# Patient Record
Sex: Male | Born: 1975 | Race: Black or African American | Hispanic: No | Marital: Married | Smoking: Former smoker
Health system: Southern US, Community
[De-identification: ages and names within clinical notes are randomized; demographics above are authoritative.]

## PROBLEM LIST (undated history)

## (undated) DIAGNOSIS — Z789 Other specified health status: Secondary | ICD-10-CM

## (undated) HISTORY — PX: NO PAST SURGERIES: SHX2092

---

## 2014-04-25 ENCOUNTER — Other Ambulatory Visit (INDEPENDENT_AMBULATORY_CARE_PROVIDER_SITE_OTHER): Payer: Self-pay | Admitting: *Deleted

## 2014-04-25 ENCOUNTER — Other Ambulatory Visit (INDEPENDENT_AMBULATORY_CARE_PROVIDER_SITE_OTHER): Payer: Self-pay

## 2014-04-25 DIAGNOSIS — D171 Benign lipomatous neoplasm of skin and subcutaneous tissue of trunk: Secondary | ICD-10-CM

## 2014-04-30 ENCOUNTER — Ambulatory Visit
Admission: RE | Admit: 2014-04-30 | Discharge: 2014-04-30 | Disposition: A | Discharge status: Home / Self Care | Payer: 59 | Source: Ambulatory Visit | Attending: General Surgery | Admitting: General Surgery

## 2014-05-09 NOTE — Progress Notes (Signed)
Please put orders in Epic surgery 05-14-14 for Same day surgery Thanks

## 2014-05-10 ENCOUNTER — Ambulatory Visit (INDEPENDENT_AMBULATORY_CARE_PROVIDER_SITE_OTHER): Payer: Self-pay | Admitting: General Surgery

## 2014-05-10 ENCOUNTER — Encounter (HOSPITAL_COMMUNITY): Payer: Self-pay | Admitting: *Deleted

## 2014-05-10 NOTE — H&P (Signed)
Calvin Wall 04/25/2014 9:02 AM Location: [facility] Patient #: [id number] DOB: 1976-02-07 Married / Language: English / Race: Black or African American Male  History of Present Illness Calvin Wall. Calvin Rossi MD; 04/25/2014 9:32 AM) Patient words: lipoma.  The patient is a 39 year old male who presents with a soft tissue mass. He is referred by Dr Kristie Cowman for evaluation of right upper back soft tissue mass. The patient states that the area has been present for at least 5 years however over the past several years it has gotten larger with time. It causes no severe pain or discomfort. He denies any numbness or tingling. He mainly notices it when he drives or when he sleeps because it does cause some discomfort when he puts pressure on the area. He denies any fever, chills, night sweats, weight loss, trauma, prior skin infection in that area. He denies any other soft tissue masses. He does smoke 4-5 cigarettes a day. He has delayed evaluation of the lesion because of not having insurance coverage. 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  Diagnostic Studies History (Calvin Eversole, LPN; 09/11/4578 9:98 AM) Colonoscopy never  Allergies (Calvin Eversole, LPN; 3/38/2505 3:97 AM) No Known Drug Allergies02/24/2016  Medication History (Calvin Eversole, LPN; 6/73/4193 7:90 AM) No Current Medications  Review of Systems (Calvin Eversole LPN; 2/40/9735 3:29 AM) General Not Present- Appetite Loss, Chills, Fatigue, Fever, Night Sweats, Weight Gain and Weight Loss. Skin Not Present- Change in Wart/Mole, Dryness, Hives, Jaundice, New Lesions, Non-Healing Wounds, Rash and Ulcer. HEENT Not Present- Earache, Hearing Loss, Hoarseness, Nose Bleed, Oral Ulcers, Ringing in the  Ears, Seasonal Allergies, Sinus Pain, Sore Throat, Visual Disturbances, Wears glasses/contact lenses and Yellow Eyes. Cardiovascular Not Present- Chest Pain, Difficulty Breathing Lying Down, Leg Cramps, Palpitations, Rapid Heart Rate, Shortness of Breath and Swelling of Extremities. Gastrointestinal Not Present- Abdominal Pain, Bloating, Bloody Stool, Change in Bowel Habits, Chronic diarrhea, Constipation, Difficulty Swallowing, Excessive gas, Gets full quickly at meals, Hemorrhoids, Indigestion, Nausea, Rectal Pain and Vomiting. Male Genitourinary Not Present- Blood in Urine, Change in Urinary Stream, Frequency, Impotence, Nocturia, Painful Urination, Urgency and Urine Leakage. Musculoskeletal Not Present- Back Pain, Joint Pain, Joint Stiffness, Muscle Pain, Muscle Weakness and Swelling of Extremities. Neurological Not Present- Decreased Memory, Fainting, Headaches, Numbness, Seizures, Tingling, Tremor, Trouble walking and Weakness. Endocrine Not Present- Cold Intolerance, Excessive Hunger, Hair Changes, Heat Intolerance, Hot flashes and New Diabetes. Hematology Not Present- Easy Bruising, Excessive bleeding, Gland problems, HIV and Persistent Infections.   Vitals (Calvin Eversole LPN; 11/23/2681 4:19 AM) 04/25/2014 9:03 AM Weight: 201.4 lb Height: 71in Body Surface Area: 2.14 m Body Mass Index: 28.09 kg/m Temp.: 97.14F(Oral)  Pulse: 82 (Regular)  BP: 108/72 (Sitting, Left Arm, Standard)    Physical Exam [name] M. [name] MD; 04/25/2014 9:31 AM) General Mental Status-Alert. General Appearance-Consistent with stated age. Hydration-Well hydrated. Voice-Normal.  Integumentary Note: OVERLYING RIGHT SCAPULA IS VERY LARGE SOFT, NONTENDER SUBCU MASS 20 X20 CM. WELL CIRCUMSCRIBED. SOMEWHAT MOBILE. NO OVERLYING SKIN LESION.   Head and Neck Head-normocephalic, atraumatic with no lesions or palpable masses. Trachea-midline. Thyroid Gland Characteristics - normal size and  consistency.  Eye Eyeball - Bilateral-Extraocular movements intact. Sclera/Conjunctiva - Bilateral-No scleral icterus.  Chest and Lung Exam Chest and lung exam reveals -quiet, even and easy respiratory  effort with no use of accessory muscles and on auscultation, normal breath sounds, no adventitious sounds and normal vocal resonance. Inspection Chest Wall - Normal. Back - normal.  Breast - Did not examine.  Cardiovascular Cardiovascular examination reveals -normal heart sounds, regular rate and rhythm with no murmurs and normal pedal pulses bilaterally.  Abdomen Inspection Inspection of the abdomen reveals - No Hernias. Skin - Scar - no surgical scars. Palpation/Percussion Palpation and Percussion of the abdomen reveal - Soft, Non Tender, No Rebound tenderness, No Rigidity (guarding) and No hepatosplenomegaly. Auscultation Auscultation of the abdomen reveals - Bowel sounds normal.  Peripheral Vascular Upper Extremity Palpation - Pulses bilaterally normal.  Neurologic Neurologic evaluation reveals -alert and oriented x 3 with no impairment of recent or remote memory. Mental Status-Normal.  Neuropsychiatric The patient's mood and affect are described as -normal. Judgment and Insight-insight is appropriate concerning matters relevant to self.  Musculoskeletal Normal Exam - Left-Upper Extremity Strength Normal and Lower Extremity Strength Normal. Normal Exam - Right-Upper Extremity Strength Normal and Lower Extremity Strength Normal.  Lymphatic Head & Neck  General Head & Neck Lymphatics: Bilateral - Description - Normal. Axillary -Note: no palpable adenopathy.  Femoral & Inguinal - Did not examine.    Assessment & Plan [name] M. 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 I believe it is less likely to be a sebaceous cyst. Given the chronicity of the mass I believe the chances  of it having any type of malignancy would be quite rare. Nonetheless given the sheer size of it I have recommended an ultrasound just to make sure there are no suspicious features before we get operating room. We discussed the only way to manage this is for surgical excision. See below for discussion regarding risk and benefits of surgery Current Plans  We discussed the etiology and management of lipomas. The patient was given educational material. We discussed that the majority of lipomas are benign although on a rare occasion it can be malignant.  We discussed observation versus surgical excision. We discussed the risks and benefits of surgery including but not limited to bleeding, infection, injury to surrounding structures, scarring, hematoma/seroma, cosmetic concerns, temporary drain placement, blood clot formation, anesthesia issues, possible recurrence, and the typical postoperative course.  The patient has elected to proceed with surgery. But we will get an ultrasound of the area given the size of the mass. Our office will contact you to schedule surgery Schedule for Surgery  Calvin Wall. Redmond Pulling, MD, FACS General, Bariatric, & Minimally Invasive Surgery [facility], [place]

## 2014-05-14 ENCOUNTER — Encounter (HOSPITAL_COMMUNITY): Payer: Self-pay

## 2014-05-14 ENCOUNTER — Ambulatory Visit (HOSPITAL_COMMUNITY)
Admission: RE | Admit: 2014-05-14 | Discharge: 2014-05-14 | Disposition: A | Discharge status: Home / Self Care | Payer: 59 | Source: Ambulatory Visit | Attending: General Surgery | Admitting: General Surgery

## 2014-05-14 ENCOUNTER — Encounter (HOSPITAL_COMMUNITY)
Admission: RE | Disposition: A | Discharge status: Home / Self Care | Payer: Self-pay | Source: Ambulatory Visit | Attending: General Surgery

## 2014-05-14 ENCOUNTER — Ambulatory Visit (HOSPITAL_COMMUNITY): Payer: 59 | Admitting: Anesthesiology

## 2014-05-14 DIAGNOSIS — D171 Benign lipomatous neoplasm of skin and subcutaneous tissue of trunk: Secondary | ICD-10-CM | POA: Diagnosis not present

## 2014-05-14 DIAGNOSIS — F17210 Nicotine dependence, cigarettes, uncomplicated: Secondary | ICD-10-CM | POA: Diagnosis not present

## 2014-05-14 HISTORY — DX: Other specified health status: Z78.9

## 2014-05-14 HISTORY — PX: LIPOMA EXCISION: SHX5283

## 2014-05-14 LAB — CBC
HCT: 40.2 % (ref 39.0–52.0)
Hemoglobin: 13.1 g/dL (ref 13.0–17.0)
MCH: 27.3 pg (ref 26.0–34.0)
MCHC: 32.6 g/dL (ref 30.0–36.0)
MCV: 83.8 fL (ref 78.0–100.0)
PLATELETS: 199 10*3/uL (ref 150–400)
RBC: 4.8 MIL/uL (ref 4.22–5.81)
RDW: 13.5 % (ref 11.5–15.5)
WBC: 3.9 10*3/uL — AB (ref 4.0–10.5)

## 2014-05-14 SURGERY — EXCISION LIPOMA
Anesthesia: General | Site: Back

## 2014-05-14 MED ORDER — FENTANYL CITRATE 0.05 MG/ML IJ SOLN
INTRAMUSCULAR | Status: DC | PRN
  Administered 2014-05-14: 50 ug via INTRAVENOUS
  Administered 2014-05-14: 100 ug via INTRAVENOUS

## 2014-05-14 MED ORDER — SODIUM CHLORIDE 0.9 % IV SOLN: 250.0000 mL | INTRAVENOUS | Status: DC | PRN

## 2014-05-14 MED ORDER — OXYCODONE HCL 5 MG PO TABS: 5.0000 mg | ORAL_TABLET | ORAL | Status: DC | PRN

## 2014-05-14 MED ORDER — CHLORHEXIDINE GLUCONATE 4 % EX LIQD: 1.0000 "application " | Freq: Once | CUTANEOUS | Status: DC

## 2014-05-14 MED ORDER — MIDAZOLAM HCL 5 MG/5ML IJ SOLN
INTRAMUSCULAR | Status: DC | PRN
  Administered 2014-05-14: 2 mg via INTRAVENOUS

## 2014-05-14 MED ORDER — MORPHINE SULFATE 10 MG/ML IJ SOLN: 1.0000 mg | INTRAMUSCULAR | Status: DC | PRN

## 2014-05-14 MED ORDER — LIDOCAINE HCL (CARDIAC) 20 MG/ML IV SOLN
INTRAVENOUS | Status: DC | PRN
  Administered 2014-05-14: 100 mg via INTRAVENOUS

## 2014-05-14 MED ORDER — PROMETHAZINE HCL 25 MG/ML IJ SOLN: 6.2500 mg | INTRAMUSCULAR | Status: DC | PRN

## 2014-05-14 MED ORDER — 0.9 % SODIUM CHLORIDE (POUR BTL) OPTIME
TOPICAL | Status: DC | PRN
  Administered 2014-05-14: 1000 mL

## 2014-05-14 MED ORDER — SODIUM CHLORIDE 0.9 % IJ SOLN: 3.0000 mL | Freq: Two times a day (BID) | INTRAMUSCULAR | Status: DC

## 2014-05-14 MED ORDER — MIDAZOLAM HCL 2 MG/2ML IJ SOLN
INTRAMUSCULAR | Status: AC
  Filled 2014-05-14: qty 2

## 2014-05-14 MED ORDER — SODIUM CHLORIDE 0.9 % IJ SOLN: 3.0000 mL | INTRAMUSCULAR | Status: DC | PRN

## 2014-05-14 MED ORDER — PROPOFOL 10 MG/ML IV BOLUS
INTRAVENOUS | Status: DC | PRN
  Administered 2014-05-14: 200 mg via INTRAVENOUS

## 2014-05-14 MED ORDER — PROPOFOL 10 MG/ML IV BOLUS
INTRAVENOUS | Status: AC
  Filled 2014-05-14: qty 20

## 2014-05-14 MED ORDER — OXYCODONE HCL 5 MG PO TABS: 5.0000 mg | ORAL_TABLET | Freq: Once | ORAL | Status: AC | PRN

## 2014-05-14 MED ORDER — CEFAZOLIN SODIUM-DEXTROSE 2-3 GM-% IV SOLR
INTRAVENOUS | Status: AC
  Filled 2014-05-14: qty 50

## 2014-05-14 MED ORDER — OXYCODONE-ACETAMINOPHEN 5-325 MG PO TABS: 1.0000 | ORAL_TABLET | ORAL | Status: DC | PRN

## 2014-05-14 MED ORDER — OXYCODONE HCL 5 MG/5ML PO SOLN
5.0000 mg | Freq: Once | ORAL | Status: AC | PRN
  Filled 2014-05-14: qty 5

## 2014-05-14 MED ORDER — TISSEEL VH 10 ML EX KIT
PACK | CUTANEOUS | Status: AC
  Filled 2014-05-14: qty 1

## 2014-05-14 MED ORDER — BUPIVACAINE-EPINEPHRINE 0.25% -1:200000 IJ SOLN
INTRAMUSCULAR | Status: DC | PRN
  Administered 2014-05-14: 28.5 mL

## 2014-05-14 MED ORDER — SUCCINYLCHOLINE CHLORIDE 20 MG/ML IJ SOLN
INTRAMUSCULAR | Status: DC | PRN
  Administered 2014-05-14: 100 mg via INTRAVENOUS

## 2014-05-14 MED ORDER — LACTATED RINGERS IV SOLN
INTRAVENOUS | Status: DC
  Administered 2014-05-14: 1000 mL via INTRAVENOUS

## 2014-05-14 MED ORDER — BACITRACIN ZINC 500 UNIT/GM EX OINT
TOPICAL_OINTMENT | CUTANEOUS | Status: DC | PRN
  Administered 2014-05-14: 1 via TOPICAL

## 2014-05-14 MED ORDER — HYDROMORPHONE HCL 1 MG/ML IJ SOLN: 0.2500 mg | INTRAMUSCULAR | Status: DC | PRN

## 2014-05-14 MED ORDER — DEXAMETHASONE SODIUM PHOSPHATE 10 MG/ML IJ SOLN
INTRAMUSCULAR | Status: DC | PRN
  Administered 2014-05-14: 10 mg via INTRAVENOUS

## 2014-05-14 MED ORDER — FENTANYL CITRATE 0.05 MG/ML IJ SOLN
INTRAMUSCULAR | Status: AC
  Filled 2014-05-14: qty 5

## 2014-05-14 MED ORDER — ACETAMINOPHEN 650 MG RE SUPP
650.0000 mg | RECTAL | Status: DC | PRN
Start: 2014-05-14 — End: 2014-05-15
  Filled 2014-05-14: qty 1

## 2014-05-14 MED ORDER — BUPIVACAINE-EPINEPHRINE (PF) 0.25% -1:200000 IJ SOLN
INTRAMUSCULAR | Status: AC
  Filled 2014-05-14: qty 30

## 2014-05-14 MED ORDER — LIDOCAINE HCL (CARDIAC) 20 MG/ML IV SOLN
INTRAVENOUS | Status: AC
  Filled 2014-05-14: qty 5

## 2014-05-14 MED ORDER — BACITRACIN ZINC 500 UNIT/GM EX OINT
TOPICAL_OINTMENT | CUTANEOUS | Status: AC
  Filled 2014-05-14: qty 28.35

## 2014-05-14 MED ORDER — ROCURONIUM BROMIDE 100 MG/10ML IV SOLN
INTRAVENOUS | Status: AC
  Filled 2014-05-14: qty 1

## 2014-05-14 MED ORDER — CEFAZOLIN SODIUM-DEXTROSE 2-3 GM-% IV SOLR
2.0000 g | INTRAVENOUS | Status: AC
  Administered 2014-05-14: 2 g via INTRAVENOUS

## 2014-05-14 MED ORDER — ACETAMINOPHEN 325 MG PO TABS: 650.0000 mg | ORAL_TABLET | ORAL | Status: DC | PRN

## 2014-05-14 MED ORDER — ONDANSETRON HCL 4 MG/2ML IJ SOLN
INTRAMUSCULAR | Status: DC | PRN
  Administered 2014-05-14: 4 mg via INTRAVENOUS

## 2014-05-14 MED ORDER — KETOROLAC TROMETHAMINE 30 MG/ML IJ SOLN: 30.0000 mg | Freq: Four times a day (QID) | INTRAMUSCULAR | Status: DC

## 2014-05-14 SURGICAL SUPPLY — 39 items
BENZOIN TINCTURE PRP APPL 2/3 (GAUZE/BANDAGES/DRESSINGS) IMPLANT
BLADE HEX COATED 2.75 (ELECTRODE) ×3 IMPLANT
BLADE SURG 15 STRL LF DISP TIS (BLADE) ×1 IMPLANT
BLADE SURG 15 STRL SS (BLADE) ×2
BLADE SURG SZ10 CARB STEEL (BLADE) IMPLANT
COVER SURGICAL LIGHT HANDLE (MISCELLANEOUS) IMPLANT
DECANTER SPIKE VIAL GLASS SM (MISCELLANEOUS) IMPLANT
DRAIN CHANNEL 15F RND FF 3/16 (WOUND CARE) ×3 IMPLANT
DRAPE LAPAROTOMY T 102X78X121 (DRAPES) ×3 IMPLANT
DRAPE LAPAROTOMY T 98X78 PEDS (DRAPES) IMPLANT
DRAPE LAPAROTOMY TRNSV 102X78 (DRAPE) IMPLANT
DRAPE SHEET LG 3/4 BI-LAMINATE (DRAPES) ×3 IMPLANT
DRAPE UTILITY XL STRL (DRAPES) ×3 IMPLANT
ELECT REM PT RETURN 9FT ADLT (ELECTROSURGICAL) ×3
ELECTRODE REM PT RTRN 9FT ADLT (ELECTROSURGICAL) ×1 IMPLANT
EVACUATOR DRAINAGE 7X20 100CC (MISCELLANEOUS) ×1 IMPLANT
EVACUATOR SILICONE 100CC (MISCELLANEOUS) ×2
GAUZE PACKING IODOFORM 1/4X15 (GAUZE/BANDAGES/DRESSINGS) IMPLANT
GAUZE SPONGE 4X4 12PLY STRL (GAUZE/BANDAGES/DRESSINGS) ×3 IMPLANT
GLOVE BIO SURGEON STRL SZ7.5 (GLOVE) ×3 IMPLANT
GLOVE BIOGEL M STRL SZ7.5 (GLOVE) IMPLANT
GLOVE INDICATOR 8.0 STRL GRN (GLOVE) ×3 IMPLANT
GOWN STRL REUS W/TWL LRG LVL3 (GOWN DISPOSABLE) ×3 IMPLANT
GOWN STRL REUS W/TWL XL LVL3 (GOWN DISPOSABLE) ×6 IMPLANT
KIT BASIN OR (CUSTOM PROCEDURE TRAY) ×3 IMPLANT
MARKER SKIN DUAL TIP RULER LAB (MISCELLANEOUS) IMPLANT
NEEDLE HYPO 25X1 1.5 SAFETY (NEEDLE) ×3 IMPLANT
PACK BASIC VI WITH GOWN DISP (CUSTOM PROCEDURE TRAY) ×3 IMPLANT
PENCIL BUTTON HOLSTER BLD 10FT (ELECTRODE) ×3 IMPLANT
SPONGE LAP 18X18 X RAY DECT (DISPOSABLE) IMPLANT
SPONGE LAP 4X18 X RAY DECT (DISPOSABLE) ×3 IMPLANT
STAPLER VISISTAT 35W (STAPLE) ×3 IMPLANT
SUT ETHILON 2 0 PS N (SUTURE) ×12 IMPLANT
SUT MNCRL AB 4-0 PS2 18 (SUTURE) ×3 IMPLANT
SUT VIC AB 3-0 SH 18 (SUTURE) ×6 IMPLANT
SYR CONTROL 10ML LL (SYRINGE) ×3 IMPLANT
TAPE CLOTH SURG 4X10 WHT LF (GAUZE/BANDAGES/DRESSINGS) ×3 IMPLANT
TOWEL OR 17X26 10 PK STRL BLUE (TOWEL DISPOSABLE) ×3 IMPLANT
YANKAUER SUCT BULB TIP 10FT TU (MISCELLANEOUS) IMPLANT

## 2014-05-14 NOTE — Anesthesia Preprocedure Evaluation (Signed)
Anesthesia Evaluation  Patient identified by MRN, date of birth, ID band Patient awake    Reviewed: Allergy & Precautions, NPO status , Patient's Chart, lab work & pertinent test results  Airway Mallampati: II  TM Distance: >3 FB Neck ROM: Full    Dental no notable dental hx.    Pulmonary neg pulmonary ROS, Current Smoker,  breath sounds clear to auscultation  Pulmonary exam normal       Cardiovascular negative cardio ROS  Rhythm:Regular Rate:Normal     Neuro/Psych negative neurological ROS  negative psych ROS   GI/Hepatic negative GI ROS, Neg liver ROS,   Endo/Other  negative endocrine ROS  Renal/GU negative Renal ROS  negative genitourinary   Musculoskeletal negative musculoskeletal ROS (+)   Abdominal   Peds negative pediatric ROS (+)  Hematology negative hematology ROS (+)   Anesthesia Other Findings   Reproductive/Obstetrics negative OB ROS                             Anesthesia Physical Anesthesia Plan  ASA: II  Anesthesia Plan: General   Post-op Pain Management:    Induction: Intravenous  Airway Management Planned: LMA and Oral ETT  Additional Equipment:   Intra-op Plan:   Post-operative Plan: Extubation in OR  Informed Consent: I have reviewed the patients History and Physical, chart, labs and discussed the procedure including the risks, benefits and alternatives for the proposed anesthesia with the patient or authorized representative who has indicated his/her understanding and acceptance.   Dental advisory given  Plan Discussed with: CRNA  Anesthesia Plan Comments: (ETT if prone)        Anesthesia Quick Evaluation

## 2014-05-14 NOTE — H&P (View-Only) (Signed)
Calvin Wall 04/25/2014 9:02 AM Location: [facility] Patient #: [id number] DOB: 1976/02/05 Married / Language: English / Race: Black or African American Male  History of Present Illness Calvin Wall. Lyda Colcord MD; 04/25/2014 9:32 AM) Patient words: lipoma.  The patient is a 39 year old male who presents with a soft tissue mass. He is referred by Dr Kristie Cowman for evaluation of right upper back soft tissue mass. The patient states that the area has been present for at least 5 years however over the past several years it has gotten larger with time. It causes no severe pain or discomfort. He denies any numbness or tingling. He mainly notices it when he drives or when he sleeps because it does cause some discomfort when he puts pressure on the area. He denies any fever, chills, night sweats, weight loss, trauma, prior skin infection in that area. He denies any other soft tissue masses. He does smoke 4-5 cigarettes a day. He has delayed evaluation of the lesion because of not having insurance coverage. 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  Diagnostic Studies History ([name], LPN; 5/88/5027 7:41 AM) Colonoscopy never  Allergies ([name], LPN; 2/87/8676 7:20 AM) No Known Drug Allergies02/24/2016  Medication History ([name], LPN; 9/47/0962 8:36 AM) No Current Medications  Review of Systems ([name] LPN; 08/29/4763 4:65 AM) General Not Present- Appetite Loss, Chills, Fatigue, Fever, Night Sweats, Weight Gain and Weight Loss. Skin Not Present- Change in Wart/Mole, Dryness, Hives, Jaundice, New Lesions, Non-Healing Wounds, Rash and Ulcer. HEENT Not Present- Earache, Hearing Loss, Hoarseness, Nose Bleed, Oral Ulcers, Ringing in the  Ears, Seasonal Allergies, Sinus Pain, Sore Throat, Visual Disturbances, Wears glasses/contact lenses and Yellow Eyes. Cardiovascular Not Present- Chest Pain, Difficulty Breathing Lying Down, Leg Cramps, Palpitations, Rapid Heart Rate, Shortness of Breath and Swelling of Extremities. Gastrointestinal Not Present- Abdominal Pain, Bloating, Bloody Stool, Change in Bowel Habits, Chronic diarrhea, Constipation, Difficulty Swallowing, Excessive gas, Gets full quickly at meals, Hemorrhoids, Indigestion, Nausea, Rectal Pain and Vomiting. Male Genitourinary Not Present- Blood in Urine, Change in Urinary Stream, Frequency, Impotence, Nocturia, Painful Urination, Urgency and Urine Leakage. Musculoskeletal Not Present- Back Pain, Joint Pain, Joint Stiffness, Muscle Pain, Muscle Weakness and Swelling of Extremities. Neurological Not Present- Decreased Memory, Fainting, Headaches, Numbness, Seizures, Tingling, Tremor, Trouble walking and Weakness. Endocrine Not Present- Cold Intolerance, Excessive Hunger, Hair Changes, Heat Intolerance, Hot flashes and New Diabetes. Hematology Not Present- Easy Bruising, Excessive bleeding, Gland problems, HIV and Persistent Infections.   Vitals ([name] LPN; 0/35/4656 8:12 AM) 04/25/2014 9:03 AM Weight: 201.4 lb Height: 71in Body Surface Area: 2.14 m Body Mass Index: 28.09 kg/m Temp.: 97.69F(Oral)  Pulse: 82 (Regular)  BP: 108/72 (Sitting, Left Arm, Standard)    Physical Exam [name] M. Mitul Hallowell MD; 04/25/2014 9:31 AM) General Mental Status-Alert. General Appearance-Consistent with stated age. Hydration-Well hydrated. Voice-Normal.  Integumentary Note: OVERLYING RIGHT SCAPULA IS VERY LARGE SOFT, NONTENDER SUBCU MASS 20 X20 CM. WELL CIRCUMSCRIBED. SOMEWHAT MOBILE. NO OVERLYING SKIN LESION.   Head and Neck Head-normocephalic, atraumatic with no lesions or palpable masses. Trachea-midline. Thyroid Gland Characteristics - normal size and  consistency.  Eye Eyeball - Bilateral-Extraocular movements intact. Sclera/Conjunctiva - Bilateral-No scleral icterus.  Chest and Lung Exam Chest and lung exam reveals -quiet, even and easy respiratory  effort with no use of accessory muscles and on auscultation, normal breath sounds, no adventitious sounds and normal vocal resonance. Inspection Chest Wall - Normal. Back - normal.  Breast - Did not examine.  Cardiovascular Cardiovascular examination reveals -normal heart sounds, regular rate and rhythm with no murmurs and normal pedal pulses bilaterally.  Abdomen Inspection Inspection of the abdomen reveals - No Hernias. Skin - Scar - no surgical scars. Palpation/Percussion Palpation and Percussion of the abdomen reveal - Soft, Non Tender, No Rebound tenderness, No Rigidity (guarding) and No hepatosplenomegaly. Auscultation Auscultation of the abdomen reveals - Bowel sounds normal.  Peripheral Vascular Upper Extremity Palpation - Pulses bilaterally normal.  Neurologic Neurologic evaluation reveals -alert and oriented x 3 with no impairment of recent or remote memory. Mental Status-Normal.  Neuropsychiatric The patient's mood and affect are described as -normal. Judgment and Insight-insight is appropriate concerning matters relevant to self.  Musculoskeletal Normal Exam - Left-Upper Extremity Strength Normal and Lower Extremity Strength Normal. Normal Exam - Right-Upper Extremity Strength Normal and Lower Extremity Strength Normal.  Lymphatic Head & Neck  General Head & Neck Lymphatics: Bilateral - Description - Normal. Axillary -Note: no palpable adenopathy.  Femoral & Inguinal - Did not examine.    Assessment & Plan [name] M. 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 I believe it is less likely to be a sebaceous cyst. Given the chronicity of the mass I believe the chances  of it having any type of malignancy would be quite rare. Nonetheless given the sheer size of it I have recommended an ultrasound just to make sure there are no suspicious features before we get operating room. We discussed the only way to manage this is for surgical excision. See below for discussion regarding risk and benefits of surgery Current Plans  We discussed the etiology and management of lipomas. The patient was given educational material. We discussed that the majority of lipomas are benign although on a rare occasion it can be malignant.  We discussed observation versus surgical excision. We discussed the risks and benefits of surgery including but not limited to bleeding, infection, injury to surrounding structures, scarring, hematoma/seroma, cosmetic concerns, temporary drain placement, blood clot formation, anesthesia issues, possible recurrence, and the typical postoperative course.  The patient has elected to proceed with surgery. But we will get an ultrasound of the area given the size of the mass. Our office will contact you to schedule surgery Schedule for Surgery  Calvin Wall. Redmond Pulling, MD, FACS General, Bariatric, & Minimally Invasive Surgery [facility], [place]

## 2014-05-14 NOTE — Interval H&P Note (Signed)
History and Physical Interval Note:  05/14/2014 11:42 AM  Calvin Wall  has presented today for surgery, with the diagnosis of Upper Back Lipoma  The various methods of treatment have been discussed with the patient and family. After consideration of risks, benefits and other options for treatment, the patient has consented to  Procedure(s): EXCISION UPPER BACK LIPOMA (N/A) as a surgical intervention .  The patient's history has been reviewed, patient examined, no change in status, stable for surgery.  I have reviewed the patient's chart and labs.  Questions were answered to the patient's satisfaction.    Leighton Ruff. Redmond Pulling, MD, [place], Bariatric, & Minimally Invasive Surgery [facility], [place]   [facility]

## 2014-05-14 NOTE — Discharge Instructions (Signed)
[place] Office Phone Number [phone]   POST OP INSTRUCTIONS  Always review your discharge instruction sheet given to you by the facility where your surgery was performed.  IF YOU HAVE DISABILITY OR FAMILY LEAVE FORMS, YOU MUST BRING THEM TO THE OFFICE FOR PROCESSING.  DO NOT GIVE THEM TO YOUR DOCTOR.  1. A prescription for pain medication may be given to you upon discharge.  Take your pain medication as prescribed, if needed.  If narcotic pain medicine is not needed, then you may take acetaminophen (Tylenol) or ibuprofen (Advil) as needed. 2. Take your usually prescribed medications unless otherwise directed 3. If you need a refill on your pain medication, please contact your pharmacy.  They will contact our office to request authorization.  Prescriptions will not be filled after 5pm or on week-ends. 4. You should eat very light the first 24 hours after surgery, such as soup, crackers, pudding, etc.  Resume your normal diet the day after surgery. 5. Most patients will experience some swelling and bruising in the incision.  Ice packs will help.  Swelling and bruising can take several days to resolve.  6. It is common to experience some constipation if taking pain medication after surgery.  Increasing fluid intake and taking a stool softener will usually help or prevent this problem from occurring.  A mild laxative (Milk of Magnesia or Miralax) should be taken according to package directions if there are no bowel movements after 48 hours. 7. Unless discharge instructions indicate otherwise, you may remove your bandages 48 hours after surgery, and you may shower at that time.  Cover incision with dry gauze and then change daily.  Any sutures or staples will be removed at the office during your follow-up visit. 8. ACTIVITIES:  You may resume regular daily activities (gradually increasing) beginning the next day.  .  You may have sexual intercourse when it is comfortable. a. You  may drive when you no longer are taking prescription pain medication, you can comfortably wear a seatbelt, and you can safely maneuver your car and apply brakes. b. RETURN TO WORK:  1-2 weeks WITH RESTRICTIONS 9. You should see your doctor in the office for a follow-up appointment approximately two weeks after your surgery.   OTHER INSTRUCTIONS: DO NOT LIFT, PUSH, OR PULL ANYTHING GREATER THAN 10 POUNDS WITH RIGHT UPPER ARM/SHOULDER FOR 2 WEEKS WHEN TO CALL YOUR DOCTOR: 1. Fever over 101.0 2. Nausea and/or vomiting. 3. Extreme swelling or bruising. 4. Continued bleeding from incision. 5. Increased pain, redness, or drainage from the incision.  The clinic staff is available to answer your questions during regular business hours.  Please dont hesitate to call and ask to speak to one of the nurses for clinical concerns.  If you have a medical emergency, go to the nearest emergency room or call 911.  A surgeon from [facility] is always on call at the hospital.  For further questions, please visit [email or web address]    Bulb Drain Home Care A bulb drain consists of a thin rubber tube and a soft, round bulb that creates a gentle suction. The rubber tube is placed in the area where you had surgery. A bulb is attached to the end of the tube that is outside the body. The bulb drain removes excess fluid that normally builds up in a surgical wound after surgery. The color and amount of fluid will vary. Immediately after surgery, the fluid is bright red and is a little thicker than water.  It may gradually change to a yellow or pink color and become more thin and water-like. When the amount decreases to about 1 or 2 tbsp in 24 hours, your health care provider will usually remove it. DAILY CARE  Keep the bulb flat (compressed) at all times, except while emptying it. The flatness creates suction. You can flatten the bulb by squeezing it firmly in the middle and then closing the  cap.  Keep sites where the tube enters the skin dry and covered with a bandage (dressing).  Secure the tube 1-2 in (2.5-5.1 cm) below the insertion sites to keep it from pulling on your stitches. The tube is stitched in place and will not slip out.  Secure the bulb as directed by your health care provider.  For the first 3 days after surgery, there usually is more fluid in the bulb. Empty the bulb whenever it becomes half full because the bulb does not create enough suction if it is too full. The bulb could also overflow. Write down how much fluid you remove each time you empty your drain. Add up the amount removed in 24 hours.  Empty the bulb at the same time every day once the amount of fluid decreases and you only need to empty it once a day. Write down the amounts and the 24-hour totals to give to your health care provider. This helps your health care provider know when the tubes can be removed. EMPTYING THE BULB DRAIN Before emptying the bulb, get a measuring cup, a piece of paper and a pen, and wash your hands.  Gently run your fingers down the tube (stripping) to empty any drainage from the tubing into the bulb. This may need to be done several times a day to clear the tubing of clots and tissue.  Open the bulb cap to release suction, which causes it to inflate. Do not touch the inside of the cap.  Gently run your fingers down the tube (stripping) to empty any drainage from the tubing into the bulb.  Hold the cap out of the way, and pour fluid into the measuring cup.   Squeeze the bulb to provide suction.  Replace the cap.   Check the tape that holds the tube to your skin. If it is becoming loose, you can remove the loose piece of tape and apply a new one. Then, pin the bulb to your shirt.   Write down the amount of fluid you emptied out. Write down the date and each time you emptied your bulb drain. (If there are 2 bulbs, note the amount of drainage from each bulb and keep the  totals separate. Your health care provider will want to know the total amounts for each drain and which tube is draining more.)   Flush the fluid down the toilet and wash your hands.   Call your health care provider once you have less than 2 tbsp of fluid collecting in the bulb drain every 24 hours. If there is drainage around the tube site, change dressings and keep the area dry. Cleanse around tube with sterile saline and place dry gauze around site. This gauze should be changed when it is soiled. If it stays clean and unsoiled, it should still be changed daily.  SEEK MEDICAL CARE IF:  Your drainage has a bad smell or is cloudy.   You have a fever.   Your drainage is increasing instead of decreasing.   Your tube fell out.   You have redness  or swelling around the tube site.   You have drainage from a surgical wound.   Your bulb drain will not stay flat after you empty it.  MAKE SURE YOU:   Understand these instructions.  Will watch your condition.  Will get help right away if you are not doing well or get worse. Document Released: 02/14/2000 Document Revised: 07/03/2013 Document Reviewed: 07/22/2011 [facility] Patient Information 2015 [place], [place]. This information is not intended to replace advice given to you by your health care provider. Make sure you discuss any questions you have with your health care provider.

## 2014-05-14 NOTE — Transfer of Care (Signed)
Immediate Anesthesia Transfer of Care Note  Patient: Calvin Wall  Procedure(s) Performed: Procedure(s): EXCISION UPPER BACK LIPOMA (N/A)  Patient Location: PACU  Anesthesia Type:General  Level of Consciousness: sedated  Airway & Oxygen Therapy: Patient Spontanous Breathing and Patient connected to face mask oxygen  Post-op Assessment: Report given to RN and Post -op Vital signs reviewed and stable  Post vital signs: Reviewed and stable  Last Vitals:  Filed Vitals:   05/14/14 0906  BP: 134/74  Pulse: 51  Temp: 36.7 C  Resp: 18    Complications: No apparent anesthesia complications

## 2014-05-14 NOTE — Op Note (Signed)
05/14/2014  4:46 PM  PATIENT:  Calvin Wall  39 y.o. male  PRE-OPERATIVE DIAGNOSIS:  Upper Back Lipoma  POST-OPERATIVE DIAGNOSIS:  upper back lipoma  PROCEDURE:  Procedure(s): EXCISION UPPER BACK LIPOMA (20 x 18cm)  SURGEON:  Surgeon(s): Greer Pickerel, MD  ASSISTANTS: none   ANESTHESIA:   general  DRAINS: (15) Blake drain(s) in the subu space   LOCAL MEDICATIONS USED:  MARCAINE     SPECIMEN:  Source of Specimen:  upper back lipoma  DISPOSITION OF SPECIMEN:  PATHOLOGY  COUNTS:  YES  INDICATION FOR PROCEDURE: 39 year old African-American presented to the office complaining of an enlarging mass on his upper back. He states that it been there for many years.  He had delayed intervention because he had no insurance.  It is now interfering With his daily living.  He desired surgical excision.  We did get an ultrasound preoperatively which confirmed a lipoma.  We discussed the risk and benefits of surgery including but not limited to bleeding, infection, seroma formation and hematoma formation, scarring, recurrence, blood clot formation, need for additional procedures, perioperative cardiac and pulmonary issues.  He elected to proceed to surgery  PROCEDURE: After obtaining informed consent he was taken to operating room 11 at [facility] endotracheal anesthesia was established.  He was then placed in the prone position on the operating room table with appropriate padding.  Sequential compression devices had been placed.  His upper back was prepped and draped in the usual standard surgical fashion with ChloraPrep.  He received IV antibiotics prior to skin incision.  A surgical timeout was performed.  The subcutaneous mass was over the medial aspect of his right scapula.  It measured 20 cm vertically by 18 cm wide.  I drew an incision with a marking pen in the midportion of the mass.  Then using a 10 blade I incised the scan for 11 cm.  The deep dermis was divided with  electrocautery.  I then lifted the deep dermis off the underlying lipoma.  I was able to mobilize it first off of the lateral side.  It went all the way down to muscular fascia.  I then circumferentially lifted and mobilized it using electrocautery.  I was eventually able to shell it out in its entirety.  There is no lipoma left behind.  This left a large cavity.  It was irrigated.  Hemostasis is achieved.  The specimen was passed off the field.  Because of the large cavity elected to leave a 15 Blake drain.  A small incision was made inferior lateral to the incision and a drain was placed in the cavity.  I also injected local deep in the fascia as well as in the subcutaneous tissue.  I also applied Tisseel to the cavity to try to get it to scar down to prevent a seroma.  The drain was secured to the skin with a 2-0 nylon.  The deep dermis was reapproximated with inverted 3-0 Vicryl sutures.  The skin was closed with multiple interrupted 2-0 nylon sutures.  Triple antibiotic ointment was placed along the incision followed by dry gauze and a drain sponge.  The patient was then placed in the supine position and extubated.  All needle, instrument and sponge counts were correct 2.  There were no immediate complications.  The patient tolerated the procedure well  PLAN OF CARE: Discharge to home after PACU  PATIENT DISPOSITION:  PACU - hemodynamically stable.   Delay start of Pharmacological VTE agent (>  24hrs) due to surgical blood loss or risk of bleeding:  not applicable  Leighton Ruff. Redmond Pulling, MD, FACS General, Bariatric, & Minimally Invasive Surgery [facility], [place]

## 2014-05-14 NOTE — Anesthesia Postprocedure Evaluation (Signed)
Anesthesia Post Note  Patient: Calvin Wall  Procedure(s) Performed: Procedure(s) (LRB): EXCISION UPPER BACK LIPOMA (N/A)  Anesthesia type: general  Patient location: PACU  Post pain: Pain level controlled  Post assessment: Patient's Cardiovascular Status Stable  Last Vitals:  Filed Vitals:   05/14/14 1400  BP: 149/110  Pulse: 51  Temp:   Resp: 18    Post vital signs: Reviewed and stable  Level of consciousness: sedated  Complications: No apparent anesthesia complications

## 2014-05-15 ENCOUNTER — Encounter (HOSPITAL_COMMUNITY): Payer: Self-pay | Admitting: General Surgery

## 2014-05-31 NOTE — Progress Notes (Signed)
Please put orders in Epic surgery 06-08-14 pre op 06-04-14 Thanks

## 2014-06-01 NOTE — Patient Instructions (Addendum)
Calvin Wall  06/01/2014   Your procedure is scheduled on:  06/08/2014    Report to [facility] Main  Entrance and follow signs to               [place] at      130pm  Call this number if you have problems the morning of surgery [phone]   Remember:  Do not eat food after midnite  May have clear liquids until 0930am then nothing by mouth am of surgery.       Take these medicines the morning of surgery with A SIP OF WATER: NONE                               You may not have any metal on your body including hair pins and              piercings  Do not wear jewelry, , lotions, powders or perfumes., deodorant.                           Men may shave face and neck.   Do not bring valuables to the hospital. [place].  Contacts, dentures or bridgework may not be worn into surgery.      Patients discharged the day of surgery will not be allowed to drive home.  Name and phone number of your driver:  Special Instructions:coughing and deep breathing exercises, leg exercises    CLEAR LIQUID DIET   Foods Allowed                                                                     Foods Excluded  Coffee and tea, regular and decaf                             liquids that you cannot  Plain Jell-O in any flavor                                             see through such as: Fruit ices (not with fruit pulp)                                     milk, soups, orange juice  Iced Popsicles                                    All solid food Carbonated beverages, regular and diet                                    Cranberry, grape and apple  juices Sports drinks like Gatorade Lightly seasoned clear broth or consume(fat free) Sugar, honey syrup  Sample Menu Breakfast                                Lunch                                     Supper Cranberry juice                    Beef broth                             Chicken broth Jell-O                                     Grape juice                           Apple juice Coffee or tea                        Jell-O                                      Popsicle                                                Coffee or tea                        Coffee or tea  _____________________________________________________________________                Please read over the following fact sheets you were given: _____________________________________________________________________             [facility] - Preparing for Surgery Before surgery, you can play an important role.  Because skin is not sterile, your skin needs to be as free of germs as possible.  You can reduce the number of germs on your skin by washing with CHG (chlorahexidine gluconate) soap before surgery.  CHG is an antiseptic cleaner which kills germs and bonds with the skin to continue killing germs even after washing. Please DO NOT use if you have an allergy to CHG or antibacterial soaps.  If your skin becomes reddened/irritated stop using the CHG and inform your nurse when you arrive at Short Stay. Do not shave (including legs and underarms) for at least 48 hours prior to the first CHG shower.  You may shave your face/neck. Please follow these instructions carefully:  1.  Shower with CHG Soap the night before surgery and the  morning of Surgery.  2.  If you choose to wash your hair, wash your hair first as usual with your  normal  shampoo.  3.  After you shampoo, rinse your hair and body thoroughly to remove the  shampoo.  4.  Use CHG as you would any other liquid soap.  You can apply chg directly  to the skin and wash                       Gently with a scrungie or clean washcloth.  5.  Apply the CHG Soap to your body ONLY FROM THE NECK DOWN.   Do not use on face/ open                           Wound or open sores. Avoid contact with eyes, ears mouth and genitals (private  parts).                       Wash face,  Genitals (private parts) with your normal soap.             6.  Wash thoroughly, paying special attention to the area where your surgery  will be performed.  7.  Thoroughly rinse your body with warm water from the neck down.  8.  DO NOT shower/wash with your normal soap after using and rinsing off  the CHG Soap.                9.  Pat yourself dry with a clean towel.            10.  Wear clean pajamas.            11.  Place clean sheets on your bed the night of your first shower and do not  sleep with pets. Day of Surgery : Do not apply any lotions/deodorants the morning of surgery.  Please wear clean clothes to the hospital/surgery center.  FAILURE TO FOLLOW THESE INSTRUCTIONS MAY RESULT IN THE CANCELLATION OF YOUR SURGERY PATIENT SIGNATURE_________________________________  NURSE SIGNATURE__________________________________  ________________________________________________________________________

## 2014-06-01 NOTE — Progress Notes (Signed)
Please put orders in [facility] for surgery on 06/08/2014.  Preop on 06/04/2014 at 1030am.  Thank You.

## 2014-06-02 ENCOUNTER — Ambulatory Visit: Payer: Self-pay | Admitting: General Surgery

## 2014-06-04 ENCOUNTER — Encounter (HOSPITAL_COMMUNITY): Payer: Self-pay

## 2014-06-04 ENCOUNTER — Encounter (HOSPITAL_COMMUNITY)
Admission: RE | Admit: 2014-06-04 | Discharge: 2014-06-04 | Disposition: A | Discharge status: Home / Self Care | Payer: 59 | Source: Ambulatory Visit | Attending: General Surgery | Admitting: General Surgery

## 2014-06-04 DIAGNOSIS — Z01812 Encounter for preprocedural laboratory examination: Secondary | ICD-10-CM | POA: Diagnosis present

## 2014-06-04 LAB — CBC
HCT: 41.3 % (ref 39.0–52.0)
Hemoglobin: 13.5 g/dL (ref 13.0–17.0)
MCH: 27.7 pg (ref 26.0–34.0)
MCHC: 32.7 g/dL (ref 30.0–36.0)
MCV: 84.6 fL (ref 78.0–100.0)
PLATELETS: 226 10*3/uL (ref 150–400)
RBC: 4.88 MIL/uL (ref 4.22–5.81)
RDW: 13.6 % (ref 11.5–15.5)
WBC: 3.9 10*3/uL — ABNORMAL LOW (ref 4.0–10.5)

## 2014-06-04 NOTE — Progress Notes (Signed)
EKG 05-14-14 EPIC

## 2014-06-07 NOTE — H&P (Signed)
  Calvin Wall 05/21/2014 4:08 PM Location: [facility] Patient #: [id number] DOB: 1975-11-13 Married / Language: English / Race: Black or African American Male  History of Present Illness Calvin Ruff. Creed Kail MD; 05/21/2014 4:25 PM) Patient words: lipoma to back.  The patient is a 39 year old male who presents with a soft tissue mass. Patient is status post excision of back mass presumed to be a lipoma on Monday 3/14 by Dr. Redmond Wall. He has a drain. After some of the initial swelling went down the first day, he does feel like there is still a portion of the lipoma present over his spine. It does not hurt, but it is right where he lays down in the center and is not comfortable. saw Dr Barry Dienes 3/17. drainage from drain about 10cc/day   Medication History Multimedia programmer, Therapist, sports, BSN; 05/21/2014 4:11 PM) No Current Medications Medications Reconciled  Vitals Occupational hygienist, BSN; 05/21/2014 4:10 PM) 05/21/2014 4:09 PM Weight: 200.2 lb Height: 71in Body Surface Area: 2.13 m Body Mass Index: 27.92 kg/m Temp.: 98.31F(Oral)  Pulse: 68 (Regular)  Resp.: 20 (Unlabored)  BP: 130/78 (Sitting, Left Arm, Standard)    Physical Exam Calvin Hiss M. Tylin Stradley MD; 05/21/2014 4:24 PM) General Mental Status-Alert. General Appearance-Consistent with stated age. Hydration-Well hydrated. Voice-Normal.  Integumentary Note: 2x3 cm mobile area over spine adjacent to divot from mass. incision c/d/i. no cellulitis, seroma, induration, fluctuance. palpable midline mass, soft well circumscribed. tried to aspirate after some local - no results     Assessment & Plan Calvin Hiss M. Dijon Cosens MD; 05/21/2014 4:27 PM) POSTOP CHECK (V67.00  Z09) Impression: no sign of infection. will remove drain today. too soon for suture removal. will bring back later this week for suture removal. unfortunately he has a persistent midline lipoma. it is not a seroma or hematoma. will plan excision of mid back lipoma  under MAC. explained would not use same incision. same risk/benefits as discussed before. pt would likie proceed. Current Plans  Schedule for Surgery Instructed to keep follow-up appointment as scheduled Instructions: continue current wound care may have a small amount of drainage from old drain site. cover with bandage and change daily. small drain site will seal up/close up over next few days  Calvin Ruff. Redmond Pulling, MD, FACS General, Bariatric, & Minimally Invasive Surgery [facility], [place]

## 2014-06-08 ENCOUNTER — Ambulatory Visit (HOSPITAL_COMMUNITY)
Admission: RE | Admit: 2014-06-08 | Discharge: 2014-06-08 | Disposition: A | Discharge status: Home / Self Care | Payer: 59 | Source: Ambulatory Visit | Attending: General Surgery | Admitting: General Surgery

## 2014-06-08 ENCOUNTER — Ambulatory Visit (HOSPITAL_COMMUNITY): Payer: 59 | Admitting: Anesthesiology

## 2014-06-08 ENCOUNTER — Encounter (HOSPITAL_COMMUNITY): Payer: Self-pay | Admitting: *Deleted

## 2014-06-08 ENCOUNTER — Encounter (HOSPITAL_COMMUNITY)
Admission: RE | Disposition: A | Discharge status: Home / Self Care | Payer: Self-pay | Source: Ambulatory Visit | Attending: General Surgery

## 2014-06-08 DIAGNOSIS — L7621 Postprocedural hemorrhage and hematoma of skin and subcutaneous tissue following a dermatologic procedure: Secondary | ICD-10-CM | POA: Diagnosis not present

## 2014-06-08 DIAGNOSIS — Y838 Other surgical procedures as the cause of abnormal reaction of the patient, or of later complication, without mention of misadventure at the time of the procedure: Secondary | ICD-10-CM | POA: Insufficient documentation

## 2014-06-08 DIAGNOSIS — D171 Benign lipomatous neoplasm of skin and subcutaneous tissue of trunk: Secondary | ICD-10-CM | POA: Insufficient documentation

## 2014-06-08 DIAGNOSIS — Z87891 Personal history of nicotine dependence: Secondary | ICD-10-CM | POA: Diagnosis not present

## 2014-06-08 HISTORY — PX: LIPOMA EXCISION: SHX5283

## 2014-06-08 SURGERY — EXCISION LIPOMA
Anesthesia: Monitor Anesthesia Care | Site: Back

## 2014-06-08 MED ORDER — ONDANSETRON HCL 4 MG/2ML IJ SOLN
INTRAMUSCULAR | Status: AC
  Filled 2014-06-08: qty 2

## 2014-06-08 MED ORDER — OXYCODONE-ACETAMINOPHEN 5-325 MG PO TABS: 1.0000 | ORAL_TABLET | ORAL | Status: AC | PRN

## 2014-06-08 MED ORDER — FENTANYL CITRATE 0.05 MG/ML IJ SOLN
INTRAMUSCULAR | Status: DC | PRN
  Administered 2014-06-08 (×2): 50 ug via INTRAVENOUS

## 2014-06-08 MED ORDER — BUPIVACAINE-EPINEPHRINE 0.25% -1:200000 IJ SOLN
INTRAMUSCULAR | Status: AC
  Filled 2014-06-08: qty 1

## 2014-06-08 MED ORDER — ONDANSETRON HCL 4 MG/2ML IJ SOLN
INTRAMUSCULAR | Status: DC | PRN
  Administered 2014-06-08: 4 mg via INTRAVENOUS

## 2014-06-08 MED ORDER — SODIUM BICARBONATE 4 % IV SOLN
INTRAVENOUS | Status: DC | PRN
  Administered 2014-06-08: 5 mL via INTRAVENOUS

## 2014-06-08 MED ORDER — LIDOCAINE HCL (CARDIAC) 20 MG/ML IV SOLN
INTRAVENOUS | Status: DC | PRN
  Administered 2014-06-08: 100 mg via INTRAVENOUS

## 2014-06-08 MED ORDER — PROPOFOL 10 MG/ML IV BOLUS
INTRAVENOUS | Status: AC
  Filled 2014-06-08: qty 20

## 2014-06-08 MED ORDER — SODIUM CHLORIDE 0.9 % IJ SOLN: 3.0000 mL | INTRAMUSCULAR | Status: DC | PRN

## 2014-06-08 MED ORDER — CEFAZOLIN SODIUM-DEXTROSE 2-3 GM-% IV SOLR
2.0000 g | INTRAVENOUS | Status: AC
  Administered 2014-06-08: 2 g via INTRAVENOUS

## 2014-06-08 MED ORDER — MIDAZOLAM HCL 5 MG/5ML IJ SOLN
INTRAMUSCULAR | Status: DC | PRN
  Administered 2014-06-08: 2 mg via INTRAVENOUS

## 2014-06-08 MED ORDER — SODIUM CHLORIDE 0.9 % IV SOLN: 250.0000 mL | INTRAVENOUS | Status: DC | PRN

## 2014-06-08 MED ORDER — ACETAMINOPHEN 325 MG PO TABS: 650.0000 mg | ORAL_TABLET | ORAL | Status: DC | PRN

## 2014-06-08 MED ORDER — PROPOFOL INFUSION 10 MG/ML OPTIME
INTRAVENOUS | Status: DC | PRN
  Administered 2014-06-08: 140 ug/kg/min via INTRAVENOUS

## 2014-06-08 MED ORDER — 0.9 % SODIUM CHLORIDE (POUR BTL) OPTIME
TOPICAL | Status: DC | PRN
  Administered 2014-06-08: 1000 mL

## 2014-06-08 MED ORDER — CEFAZOLIN SODIUM-DEXTROSE 2-3 GM-% IV SOLR
INTRAVENOUS | Status: AC
  Filled 2014-06-08: qty 50

## 2014-06-08 MED ORDER — LIDOCAINE HCL 1 % IJ SOLN
INTRAMUSCULAR | Status: AC
  Filled 2014-06-08: qty 20

## 2014-06-08 MED ORDER — LACTATED RINGERS IV SOLN
INTRAVENOUS | Status: DC
  Administered 2014-06-08: 1000 mL via INTRAVENOUS

## 2014-06-08 MED ORDER — HYDROMORPHONE HCL 1 MG/ML IJ SOLN: 0.2500 mg | INTRAMUSCULAR | Status: DC | PRN

## 2014-06-08 MED ORDER — ACETAMINOPHEN 650 MG RE SUPP
650.0000 mg | RECTAL | Status: DC | PRN
  Filled 2014-06-08: qty 1

## 2014-06-08 MED ORDER — FENTANYL CITRATE 0.05 MG/ML IJ SOLN
INTRAMUSCULAR | Status: AC
  Filled 2014-06-08: qty 2

## 2014-06-08 MED ORDER — BUPIVACAINE-EPINEPHRINE 0.25% -1:200000 IJ SOLN
INTRAMUSCULAR | Status: DC | PRN
  Administered 2014-06-08: 15 mL

## 2014-06-08 MED ORDER — MORPHINE SULFATE 10 MG/ML IJ SOLN: 1.0000 mg | INTRAMUSCULAR | Status: DC | PRN

## 2014-06-08 MED ORDER — OXYCODONE HCL 5 MG PO TABS: 5.0000 mg | ORAL_TABLET | ORAL | Status: DC | PRN

## 2014-06-08 MED ORDER — SODIUM CHLORIDE 0.9 % IJ SOLN: 3.0000 mL | Freq: Two times a day (BID) | INTRAMUSCULAR | Status: DC

## 2014-06-08 MED ORDER — LIDOCAINE HCL (CARDIAC) 20 MG/ML IV SOLN
INTRAVENOUS | Status: AC
  Filled 2014-06-08: qty 5

## 2014-06-08 MED ORDER — SODIUM BICARBONATE 4 % IV SOLN
INTRAVENOUS | Status: AC
  Filled 2014-06-08: qty 5

## 2014-06-08 MED ORDER — MIDAZOLAM HCL 2 MG/2ML IJ SOLN
INTRAMUSCULAR | Status: AC
  Filled 2014-06-08: qty 2

## 2014-06-08 MED ORDER — KETOROLAC TROMETHAMINE 30 MG/ML IJ SOLN: 30.0000 mg | Freq: Four times a day (QID) | INTRAMUSCULAR | Status: DC

## 2014-06-08 SURGICAL SUPPLY — 40 items
BENZOIN TINCTURE PRP APPL 2/3 (GAUZE/BANDAGES/DRESSINGS) ×3 IMPLANT
BLADE HEX COATED 2.75 (ELECTRODE) ×3 IMPLANT
BLADE SURG 15 STRL LF DISP TIS (BLADE) ×1 IMPLANT
BLADE SURG 15 STRL SS (BLADE) ×2
BLADE SURG SZ10 CARB STEEL (BLADE) ×3 IMPLANT
CLOSURE WOUND 1/2 X4 (GAUZE/BANDAGES/DRESSINGS) ×1
COVER SURGICAL LIGHT HANDLE (MISCELLANEOUS) IMPLANT
DECANTER SPIKE VIAL GLASS SM (MISCELLANEOUS) IMPLANT
DRAPE LAPAROTOMY T 102X78X121 (DRAPES) IMPLANT
DRAPE LAPAROTOMY T 98X78 PEDS (DRAPES) ×3 IMPLANT
DRAPE LAPAROTOMY TRNSV 102X78 (DRAPE) IMPLANT
DRAPE SHEET LG 3/4 BI-LAMINATE (DRAPES) IMPLANT
DRAPE UTILITY XL STRL (DRAPES) ×3 IMPLANT
DRSG TEGADERM 4X4.75 (GAUZE/BANDAGES/DRESSINGS) ×3 IMPLANT
ELECT REM PT RETURN 9FT ADLT (ELECTROSURGICAL) ×3
ELECTRODE REM PT RTRN 9FT ADLT (ELECTROSURGICAL) ×1 IMPLANT
GAUZE PACKING IODOFORM 1/4X15 (GAUZE/BANDAGES/DRESSINGS) IMPLANT
GAUZE SPONGE 2X2 8PLY STRL LF (GAUZE/BANDAGES/DRESSINGS) ×1 IMPLANT
GAUZE SPONGE 4X4 12PLY STRL (GAUZE/BANDAGES/DRESSINGS) IMPLANT
GLOVE BIO SURGEON STRL SZ7.5 (GLOVE) ×3 IMPLANT
GLOVE BIOGEL M STRL SZ7.5 (GLOVE) IMPLANT
GLOVE INDICATOR 8.0 STRL GRN (GLOVE) ×3 IMPLANT
GOWN STRL REUS W/TWL LRG LVL3 (GOWN DISPOSABLE) IMPLANT
GOWN STRL REUS W/TWL XL LVL3 (GOWN DISPOSABLE) ×6 IMPLANT
KIT BASIN OR (CUSTOM PROCEDURE TRAY) ×3 IMPLANT
MARKER SKIN DUAL TIP RULER LAB (MISCELLANEOUS) ×3 IMPLANT
NEEDLE HYPO 25X1 1.5 SAFETY (NEEDLE) ×3 IMPLANT
PACK BASIC VI WITH GOWN DISP (CUSTOM PROCEDURE TRAY) ×3 IMPLANT
PENCIL BUTTON HOLSTER BLD 10FT (ELECTRODE) ×3 IMPLANT
SPONGE GAUZE 2X2 STER 10/PKG (GAUZE/BANDAGES/DRESSINGS) ×2
SPONGE LAP 18X18 X RAY DECT (DISPOSABLE) IMPLANT
SPONGE LAP 4X18 X RAY DECT (DISPOSABLE) ×3 IMPLANT
STAPLER VISISTAT 35W (STAPLE) IMPLANT
STRIP CLOSURE SKIN 1/2X4 (GAUZE/BANDAGES/DRESSINGS) ×2 IMPLANT
SUT MNCRL AB 4-0 PS2 18 (SUTURE) ×3 IMPLANT
SUT VIC AB 3-0 SH 18 (SUTURE) ×3 IMPLANT
SYR CONTROL 10ML LL (SYRINGE) ×3 IMPLANT
SYRINGE 20CC LL (MISCELLANEOUS) ×3 IMPLANT
TOWEL OR 17X26 10 PK STRL BLUE (TOWEL DISPOSABLE) ×3 IMPLANT
YANKAUER SUCT BULB TIP 10FT TU (MISCELLANEOUS) ×3 IMPLANT

## 2014-06-08 NOTE — Transfer of Care (Signed)
Immediate Anesthesia Transfer of Care Note  Patient: Calvin Wall  Procedure(s) Performed: Procedure(s): EXCISION MIDBACK LIPOMA (N/A)  Patient Location: PACU  Anesthesia Type:MAC  Level of Consciousness: awake, sedated and patient cooperative  Airway & Oxygen Therapy: Patient Spontanous Breathing and Patient connected to face mask oxygen  Post-op Assessment: Report given to RN and Post -op Vital signs reviewed and stable  Post vital signs: Reviewed and stable  Last Vitals:  Filed Vitals:   06/08/14 1311  BP: 118/71  Pulse: 48  Temp: 36.3 C  Resp: 18    Complications: No apparent anesthesia complications

## 2014-06-08 NOTE — Anesthesia Postprocedure Evaluation (Signed)
  Anesthesia Post-op Note  Patient: Calvin Wall  Procedure(s) Performed: Procedure(s): EXCISION MIDBACK LIPOMA (N/A)  Patient Location: PACU  Anesthesia Type: MAC  Level of Consciousness: awake and alert   Airway and Oxygen Therapy: Patient Spontanous Breathing  Post-op Pain: none  Post-op Assessment: Post-op Vital signs reviewed, Patient's Cardiovascular Status Stable and Respiratory Function Stable  Post-op Vital Signs: Reviewed  Filed Vitals:   06/08/14 1708  BP: 129/87  Pulse: 46  Temp: 36.7 C  Resp: 15    Complications: No apparent anesthesia complications

## 2014-06-08 NOTE — Op Note (Signed)
06/08/2014  4:17 PM  PATIENT:  Calvin Wall  39 y.o. male  PRE-OPERATIVE DIAGNOSIS: mid upper Back Lipoma  POST-OPERATIVE DIAGNOSIS:  Mid upper Back Lipoma (4cm)  PROCEDURE:  Procedure(s): EXCISION MIDBACK LIPOMA  SURGEON:  Surgeon(s): Greer Pickerel, MD  ASSISTANTS: none   ANESTHESIA:   MAC and local  DRAINS: none   LOCAL MEDICATIONS USED:  MARCAINE     SPECIMEN:  Source of Specimen:  mid back lipoma  DISPOSITION OF SPECIMEN:  PATHOLOGY  COUNTS:  YES  INDICATION FOR PROCEDURE: 39 year old gentleman who underwent excision of a massive right upper back lipoma by me a few weeks ago came back to the office complaining of a separate lump in his mid back. It was not a seroma or hematoma. It was completely separate from his prior lipoma. On exam it was consistent with a lipoma. He desired excision. We discussed the risk and benefits of surgery including but not limited to bleeding, infection, injury surrounding structures, seroma formation, hematoma, blood clots, typical postoperative recovery course, scarring, recurrence.  PROCEDURE: In the holding area I confirmed the location and outlined it with a marking pen. He was taken to the OR 1 at [facility] long hospital lay supine and monitored anesthesia care was established. He was then placed in the lateral position with the left side down with the appropriate padding. His upper back was prepped and draped in the usual standard surgical fashion with ChloraPrep. He received IV antibiotics prior to skin incision. A surgical timeout was performed. I infiltrated local directly over the lipoma in the mid thoracic back area. I then incised the skin vertically with a 15 blade for about 4 cm. The deep dermis was divided with electrocautery along with a saphenous tissue. I came across a well circumscribed section of adipose tissue consistent with lipoma. It was excised in its entirety. I did a finger sweep in the cavity and felt no residual lipoma.  Additional local was infiltrated. Hemostasis was achieved. The deep dermis was reapproximated with 3-0 Vicryl in a interrupted fashion. The skin was reapproximated with a 4-0 Monocryl in a subcuticular fashion followed by the application of benzoin, Steri-Strips, and a Tegaderm. He did have what appeared to be a palpable residual seroma in the old lipoma site on the right side of his back. I made a skin wheal with local. I then aspirated about 20 mL of straw-colored fluid which was discarded. A Steri-Strip was placed over the needle puncture site. All instruments, sponge, and needle counts were correct 2. There were no immediate complications. The patient tolerated the procedure well.  PLAN OF CARE: Discharge to home after PACU  PATIENT DISPOSITION:  PACU - hemodynamically stable.   Delay start of Pharmacological VTE agent (>24hrs) due to surgical blood loss or risk of bleeding:  not applicable  Leighton Ruff. Redmond Pulling, MD, FACS General, Bariatric, & Minimally Invasive Surgery [facility], [place]

## 2014-06-08 NOTE — Anesthesia Preprocedure Evaluation (Addendum)
Anesthesia Evaluation  Patient identified by MRN, date of birth, ID band Patient awake    Reviewed: Allergy & Precautions, H&P , NPO status , Patient's Chart, lab work & pertinent test results  Airway Mallampati: I  TM Distance: >3 FB Neck ROM: Full    Dental no notable dental hx. (+) Teeth Intact, Dental Advisory Given   Pulmonary neg pulmonary ROS, former smoker,  breath sounds clear to auscultation  Pulmonary exam normal       Cardiovascular negative cardio ROS  Rhythm:Regular Rate:Normal     Neuro/Psych negative neurological ROS  negative psych ROS   GI/Hepatic negative GI ROS, Neg liver ROS,   Endo/Other  negative endocrine ROS  Renal/GU negative Renal ROS  negative genitourinary   Musculoskeletal   Abdominal   Peds  Hematology negative hematology ROS (+)   Anesthesia Other Findings   Reproductive/Obstetrics negative OB ROS                           Anesthesia Physical Anesthesia Plan  ASA: I  Anesthesia Plan: MAC   Post-op Pain Management:    Induction: Intravenous  Airway Management Planned: Simple Face Mask  Additional Equipment:   Intra-op Plan:   Post-operative Plan:   Informed Consent: I have reviewed the patients History and Physical, chart, labs and discussed the procedure including the risks, benefits and alternatives for the proposed anesthesia with the patient or authorized representative who has indicated his/her understanding and acceptance.   Dental advisory given  Plan Discussed with: CRNA  Anesthesia Plan Comments:        Anesthesia Quick Evaluation

## 2014-06-08 NOTE — Discharge Instructions (Signed)
[place] Office Phone Number [phone]  POST OP INSTRUCTIONS  Always review your discharge instruction sheet given to you by the facility where your surgery was performed.  IF YOU HAVE DISABILITY OR FAMILY LEAVE FORMS, YOU MUST BRING THEM TO THE OFFICE FOR PROCESSING.  DO NOT GIVE THEM TO YOUR DOCTOR.  1. A prescription for pain medication may be given to you upon discharge.  Take your pain medication as prescribed, if needed.  If narcotic pain medicine is not needed, then you may take acetaminophen (Tylenol) or ibuprofen (Advil) as needed. 2. Take your usually prescribed medications unless otherwise directed 3. If you need a refill on your pain medication, please contact your pharmacy.  They will contact our office to request authorization.  Prescriptions will not be filled after 5pm or on week-ends. 4. You should eat very light the first 24 hours after surgery, such as soup, crackers, pudding, etc.  Resume your normal diet the day after surgery. 5. Most patients will experience some swelling and bruising in the area of the incision.  Ice packs  will help.  Swelling and bruising can take several days to resolve.  6. It is common to experience some constipation if taking pain medication after surgery.  Increasing fluid intake and taking a stool softener will usually help or prevent this problem from occurring.  A mild laxative (Milk of Magnesia or Miralax) should be taken according to package directions if there are no bowel movements after 48 hours. 7. Unless discharge instructions indicate otherwise, you may remove your bandages 48 hours after surgery, and you may shower at that time.  You  have steri-strips (small skin tapes) in place directly over the incision.  These strips should be left on the skin for 7-10 days.   8. ACTIVITIES:  You may resume regular daily activities (gradually increasing) beginning the next day.  Wearing a good support bra or sports bra minimizes pain  and swelling.  You may have sexual intercourse when it is comfortable. a. You may drive when you no longer are taking prescription pain medication, you can comfortably wear a seatbelt, and you can safely maneuver your car and apply brakes. b. RETURN TO WORK:  You should see your doctor in the office for a follow-up appointment approximately 3 weeks after your surgery.     OTHER INSTRUCTIONS:  WHEN TO CALL YOUR DOCTOR: 1. Fever over 101.0 2. Nausea and/or vomiting. 3. Extreme swelling or bruising. 4. Continued bleeding from incision. 5. Increased pain, redness, or drainage from the incision.  The clinic staff is available to answer your questions during regular business hours.  Please dont hesitate to call and ask to speak to one of the nurses for clinical concerns.  If you have a medical emergency, go to the nearest emergency room or call 911.  A surgeon from [facility] is always on call at the hospital.  For further questions, please visit [email or web address]

## 2014-06-08 NOTE — Interval H&P Note (Signed)
History and Physical Interval Note:  06/08/2014 2:49 PM  Calvin Wall  has presented today for surgery, with the diagnosis of Back Lipoma  The various methods of treatment have been discussed with the patient and family. After consideration of risks, benefits and other options for treatment, the patient has consented to  Procedure(s): EXCISION MIDBACK LIPOMA (N/A) as a surgical intervention .  The patient's history has been reviewed, patient examined, no change in status, stable for surgery.  I have reviewed the patient's chart and labs.  Questions were answered to the patient's satisfaction.    Alert &o x3 cta  Reg Appropriate Back - well healed right upper back incision with palpable seroma - no infection; mid back with palp well circumscribed lipoma   [name]

## 2014-06-11 ENCOUNTER — Encounter (HOSPITAL_COMMUNITY): Payer: Self-pay | Admitting: General Surgery

## 2016-05-10 IMAGING — US US CHEST/MEDIASTINUM
1 series · 13 of 13 positions shown · non-contrast
Comparison: None.

CLINICAL DATA: Lipoma of back which has grown significantly over
several years. Nontender.

EXAM:
CHEST ULTRASOUND

[Series 1: us chest/mediastinum · 0.13mm/px · 13 of 13 slices shown]
[im 1/13]
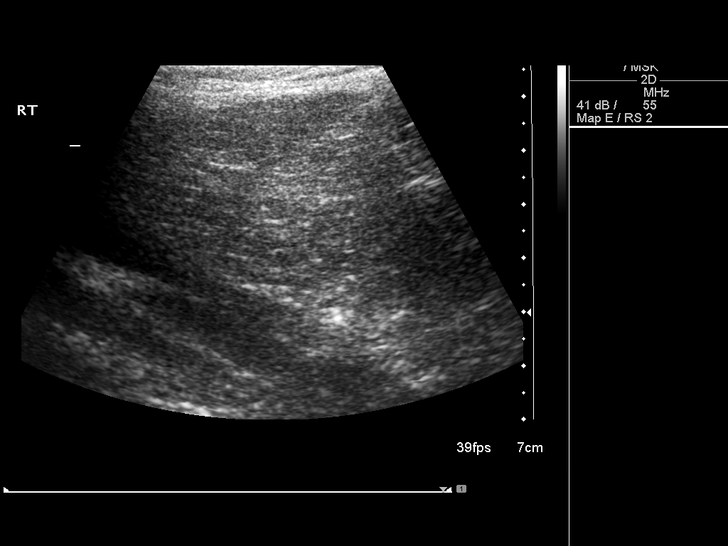
[im 2/13]
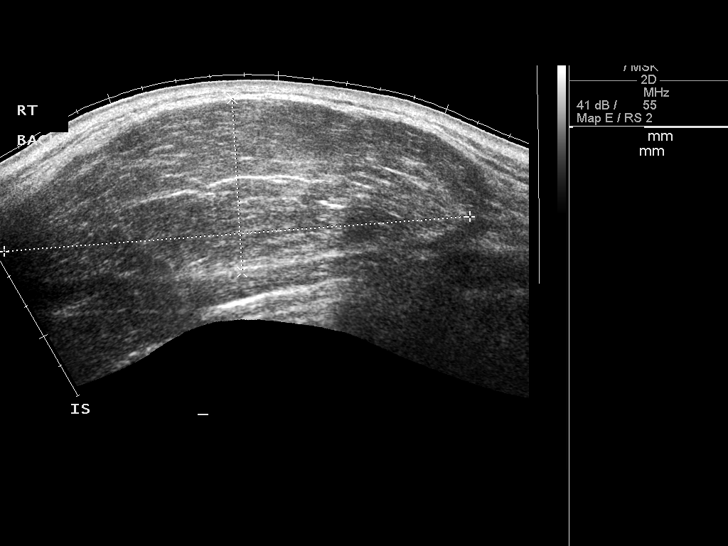
[im 3/13]
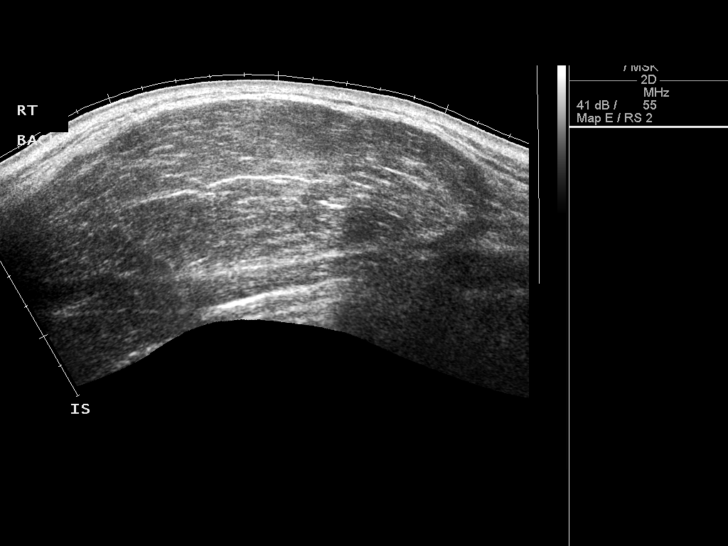
[im 4/13]
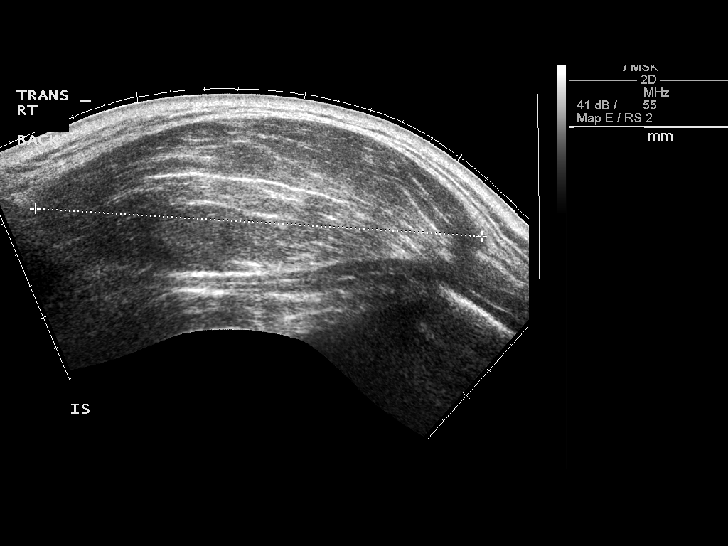
[im 5/13]
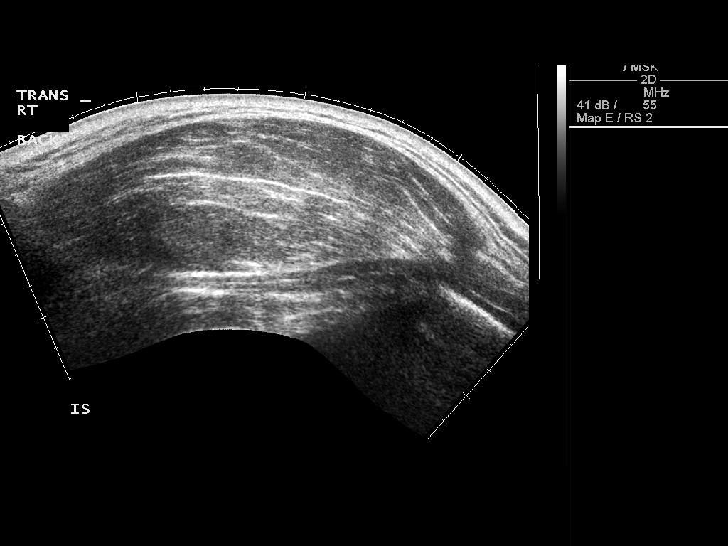
[im 6/13]
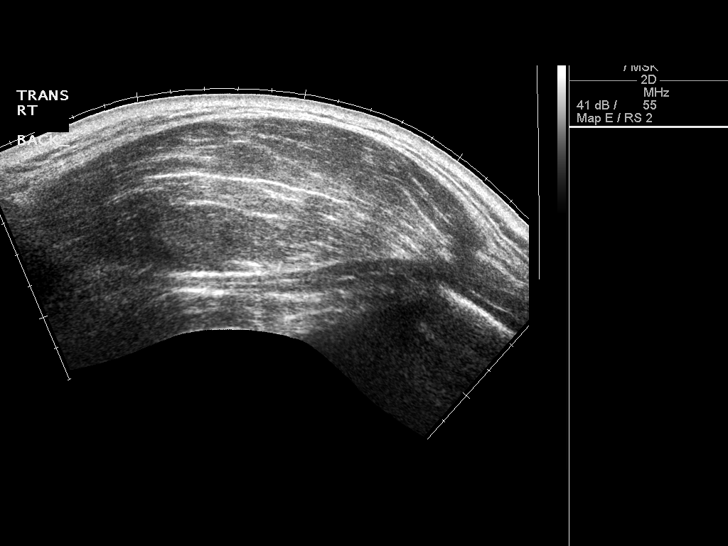
[im 7/13]
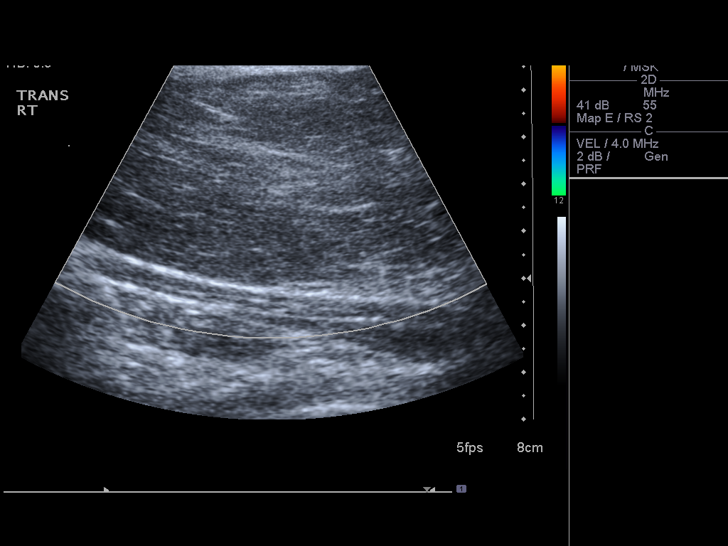
[im 8/13]
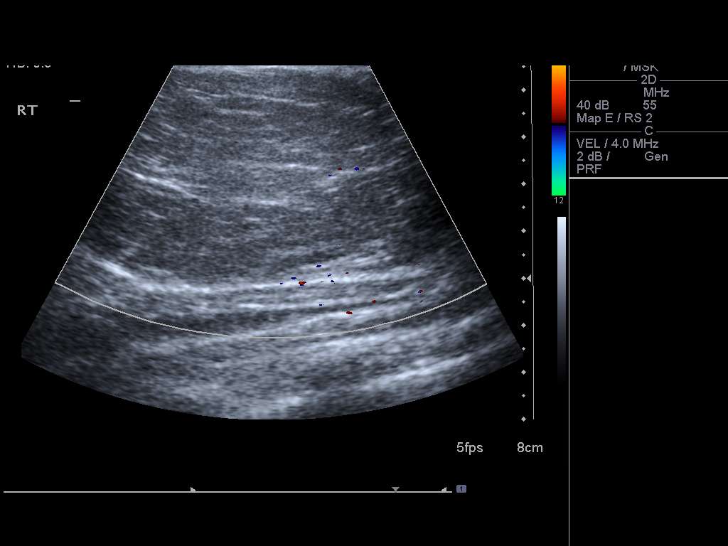
[im 9/13]
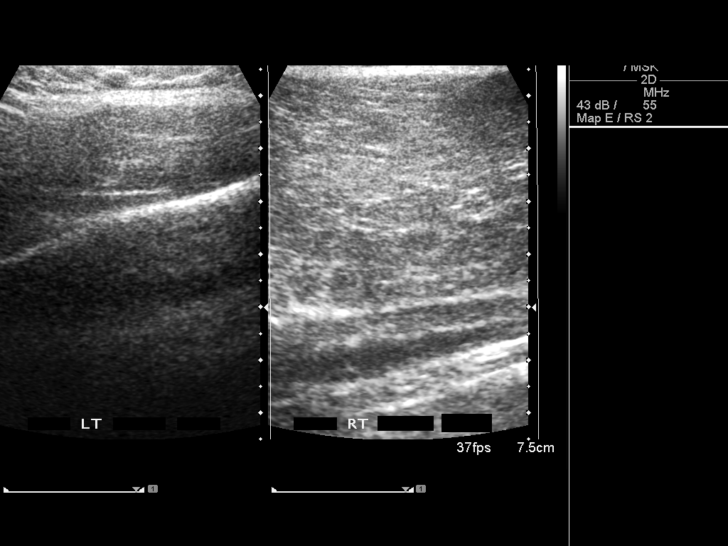
[im 10/13]
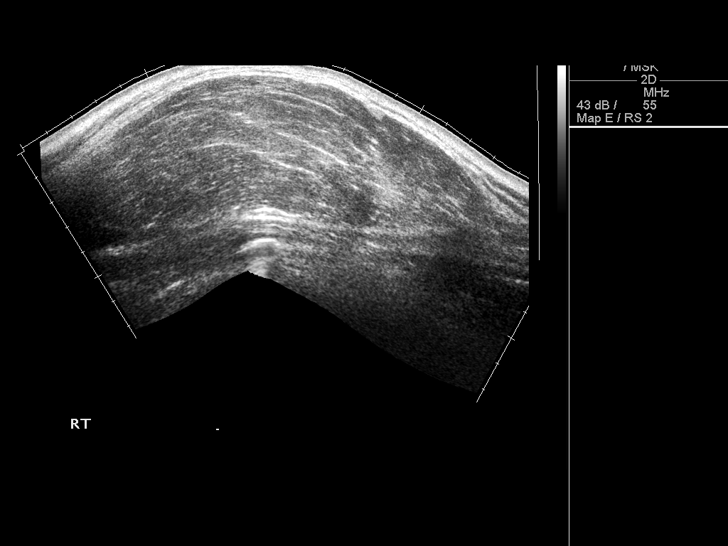
[im 11/13]
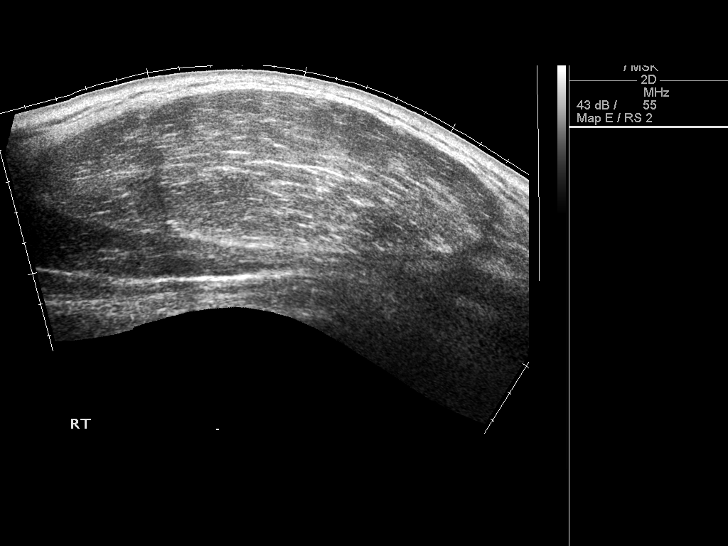
[im 12/13]
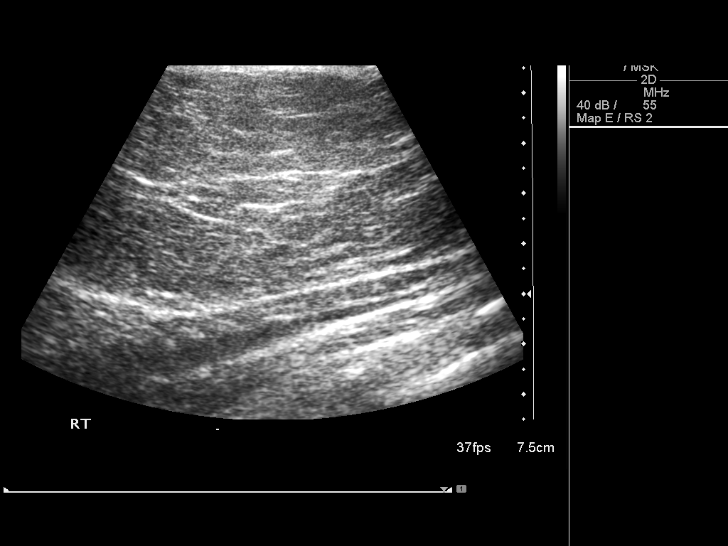
[im 13/13]
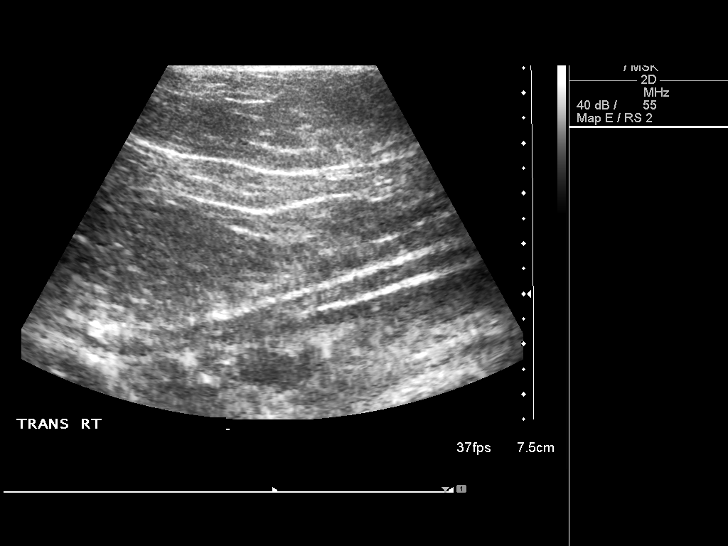

[13 of 13 positions shown; findings below may reference images not displayed]

FINDINGS: Large soft tissue mass in the posterior back. This has fairly
homogeneous signal characteristics similar to that of fat. No cystic
change no calcifications. Mass is located in the right posterior
Back.
IMPRESSION: Palpable area is a solid mass in the right upper posterior back.
This may represent fatty tissue. Given the considerable growth over
time, liposarcoma is possible. Surgical evaluation for resection is
recommended.

## 2019-02-16 ENCOUNTER — Other Ambulatory Visit: Payer: Self-pay

## 2019-02-16 DIAGNOSIS — Z20822 Contact with and (suspected) exposure to covid-19: Secondary | ICD-10-CM

## 2019-02-17 LAB — NOVEL CORONAVIRUS, NAA: SARS-CoV-2, NAA: DETECTED — AB

## 2019-02-18 ENCOUNTER — Telehealth (HOSPITAL_COMMUNITY): Payer: Self-pay | Admitting: Critical Care Medicine

## 2019-02-18 NOTE — Telephone Encounter (Signed)
I connected with this patient who is positive for Covid he has had headaches and diarrhea and was tested December 17  He does not have any risk factors for monoclonal antibody he understands the quarantine isolation.  Other members of his family are positive as well
